# Patient Record
Sex: Female | Born: 2005
Health system: Southern US, Community
[De-identification: ages and names within clinical notes are randomized; demographics above are authoritative.]

## PROBLEM LIST (undated history)

## (undated) DIAGNOSIS — T7840XA Allergy, unspecified, initial encounter: Secondary | ICD-10-CM

## (undated) DIAGNOSIS — R569 Unspecified convulsions: Secondary | ICD-10-CM

## (undated) HISTORY — PX: FEMUR BIOPSY: SHX1592

---
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
## 2024-01-10 ENCOUNTER — Other Ambulatory Visit (HOSPITAL_COMMUNITY): Payer: Self-pay

## 2024-01-11 ENCOUNTER — Other Ambulatory Visit: Payer: Self-pay

## 2024-01-12 ENCOUNTER — Other Ambulatory Visit (HOSPITAL_COMMUNITY): Payer: Self-pay

## 2024-01-23 ENCOUNTER — Other Ambulatory Visit: Payer: Self-pay

## 2024-02-01 ENCOUNTER — Ambulatory Visit: Payer: Commercial Managed Care - PPO | Admitting: Physician Assistant

## 2024-02-01 ENCOUNTER — Encounter: Payer: Self-pay | Admitting: Physician Assistant

## 2024-02-01 VITALS — BP 109/66 | HR 74 | Temp 98.1°F | Ht 62.02 in | Wt 131.6 lb

## 2024-02-01 DIAGNOSIS — Z7189 Other specified counseling: Secondary | ICD-10-CM

## 2024-02-01 DIAGNOSIS — Z7689 Persons encountering health services in other specified circumstances: Secondary | ICD-10-CM

## 2024-02-01 NOTE — Progress Notes (Signed)
 New Patient Office Visit  Subjective    Patient ID: Kristi Henderson, female    DOB: February 24, 2006  Age: 18 y.o. MRN: [id number]  CC: No chief complaint on file.   HPI Kristi Henderson presents to establish care  Patient with no significant medical history presents today to establish care. Daily medications include famotidine , Singulair , and OCPs. She follows with [facility] for management of her birth control. She has no concerns or complaints today. Due for 17 year WCC end of March.   Outpatient Encounter Medications as of 02/01/2024  Medication Sig   albuterol  (VENTOLIN  HFA) 108 (90 Base) MCG/ACT inhaler Inhale 2 puffs into the lungs every 6 (six) hours as needed for wheezing or shortness of breath.   dicyclomine  (BENTYL ) 20 MG tablet Take 1 tablet (20 mg total) by mouth 2 (two) times daily as needed for spasms.   famotidine  (PEPCID ) 20 MG tablet Take 1 tablet by mouth daily   ibuprofen  (ADVIL ) 200 MG tablet Take 800 mg by mouth as needed.   montelukast  (SINGULAIR ) 10 MG tablet Take 1 tablet by mouth every evening   Naproxen Sodium (ALEVE PO) Take by mouth.   Norethindrone-Ethinyl Estradiol -Fe Biphas (LO LOESTRIN FE ) 1 MG-10 MCG / 10 MCG tablet Take 1 tablet by mouth daily.   No facility-administered encounter medications on file as of 02/01/2024.    Past Medical History:  Diagnosis Date   Allergy    Seizures (HCC)    febrile seizure 2008    Past Surgical History:  Procedure Laterality Date   FEMUR BIOPSY     SHOULDER ARTHROSCOPY WITH BANKART REPAIR Left 11/27/2019   Procedure: LEFT SHOULDER ARTHROSCOPY WITH BANKART REPAIR;  Surgeon: Cristy Bonner DASEN, MD;  Location: [facility];  Service: Orthopedics;  Laterality: Left;    Family History  Problem Relation Age of Onset   Diabetes Paternal Grandfather    Heart disease Paternal Grandfather    Dementia Paternal Grandfather    Diabetes Paternal Grandmother    Dementia Paternal Grandmother    Heart disease Paternal  Grandmother    Thyroid disease Paternal Grandmother    Hypertension Father     Social History   Socioeconomic History   Marital status: Single    Spouse name: Not on file   Number of children: Not on file   Years of education: Not on file   Highest education level: 12th grade  Occupational History   Not on file  Tobacco Use   Smoking status: Never   Smokeless tobacco: Never  Vaping Use   Vaping status: Never Used  Substance and Sexual Activity   Alcohol use: Never   Drug use: Never   Sexual activity: Never    Birth control/protection: Abstinence, Pill  Other Topics Concern   Not on file  Social History Narrative   Not on file   Social Drivers of Health   Financial Resource Strain: Low Risk  (01/31/2024)   Overall Financial Resource Strain (CARDIA)    Difficulty of Paying Living Expenses: Not hard at all  Food Insecurity: No Food Insecurity (01/31/2024)   Hunger Vital Sign    Worried About Running Out of Food in the Last Year: Never true    Ran Out of Food in the Last Year: Never true  Transportation Needs: No Transportation Needs (01/31/2024)   PRAPARE - Administrator, Civil Service (Medical): No    Lack of Transportation (Non-Medical): No  Physical Activity: Sufficiently Active (01/31/2024)  Exercise Vital Sign    Days of Exercise per Week: 7 days    Minutes of Exercise per Session: 100 min  Stress: No Stress Concern Present (01/31/2024)   [employer] of Occupational Health - Occupational Stress Questionnaire    Feeling of Stress : Not at all  Social Connections: Moderately Integrated (01/31/2024)   Social Connection and Isolation Panel [NHANES]    Frequency of Communication with Friends and Family: More than three times a week    Frequency of Social Gatherings with Friends and Family: Three times a week    Attends Religious Services: More than 4 times per year    Active Member of Clubs or Organizations: Yes    Attends Banker Meetings:  More than 4 times per year    Marital Status: Never married  Intimate Partner Violence: Not At Risk (03/17/2023)   Humiliation, Afraid, Rape, and Kick questionnaire    Fear of Current or Ex-Partner: No    Emotionally Abused: No    Physically Abused: No    Sexually Abused: No    Review of Systems  Constitutional:  Negative for fever and weight loss.  Eyes:  Negative for blurred vision and double vision.  Respiratory:  Negative for cough and shortness of breath.   Cardiovascular:  Negative for chest pain and palpitations.  Gastrointestinal:  Negative for abdominal pain, heartburn and nausea.  Skin:  Negative for rash.  Neurological:  Negative for headaches.        Objective    BP 109/66   Pulse 74   Temp 98.1 F (36.7 C)   Ht 5' 2.02 (1.575 m)   Wt 131 lb 9.6 oz (59.7 kg)   SpO2 100%   BMI 24.05 kg/m   Physical Exam Constitutional:      Appearance: Normal appearance.  HENT:     Head: Normocephalic.     Mouth/Throat:     Mouth: Mucous membranes are moist.     Pharynx: Oropharynx is clear.  Eyes:     Extraocular Movements: Extraocular movements intact.     Conjunctiva/sclera: Conjunctivae normal.  Cardiovascular:     Rate and Rhythm: Normal rate and regular rhythm.     Heart sounds: No murmur heard.    No gallop.  Pulmonary:     Effort: Pulmonary effort is normal.     Breath sounds: No wheezing, rhonchi or rales.  Skin:    General: Skin is warm and dry.  Neurological:     General: No focal deficit present.     Mental Status: She is alert and oriented to person, place, and time.  Psychiatric:        Mood and Affect: Mood normal.        Behavior: Behavior normal.       Assessment & Plan:  Encounter to establish care  Patient appears well today, physical exam without any abnormal findings. She does not need medication refills today. Obtaining vaccination records to update Tdap and HPV in the system as patient reports receiving these vaccines. Patient  following up in April for annual physical.   Return in about 2 months (around 03/31/2024) for [facility] 17y.   Kristi Laylaa Guevarra, PA-C

## 2024-02-12 DIAGNOSIS — S46211A Strain of muscle, fascia and tendon of other parts of biceps, right arm, initial encounter: Secondary | ICD-10-CM | POA: Diagnosis not present

## 2024-02-19 DIAGNOSIS — S46211D Strain of muscle, fascia and tendon of other parts of biceps, right arm, subsequent encounter: Secondary | ICD-10-CM | POA: Diagnosis not present

## 2024-03-14 ENCOUNTER — Other Ambulatory Visit (HOSPITAL_COMMUNITY): Payer: Self-pay

## 2024-03-18 DIAGNOSIS — S46211D Strain of muscle, fascia and tendon of other parts of biceps, right arm, subsequent encounter: Secondary | ICD-10-CM | POA: Diagnosis not present

## 2024-03-31 ENCOUNTER — Encounter: Payer: Self-pay | Admitting: Physician Assistant

## 2024-03-31 ENCOUNTER — Ambulatory Visit: Payer: Self-pay | Admitting: Physician Assistant

## 2024-03-31 VITALS — BP 118/64 | HR 66 | Temp 98.1°F | Ht 63.78 in | Wt 134.4 lb

## 2024-03-31 DIAGNOSIS — Z0000 Encounter for general adult medical examination without abnormal findings: Secondary | ICD-10-CM

## 2024-03-31 DIAGNOSIS — Z68.41 Body mass index (BMI) pediatric, 5th percentile to less than 85th percentile for age: Secondary | ICD-10-CM | POA: Diagnosis not present

## 2024-03-31 NOTE — Progress Notes (Signed)
 Complete physical exam  Patient: Kristi Henderson   DOB: 2006/12/02   17 y.o. Female  MRN: [id number]  Subjective:    No chief complaint on file.   Kristi Henderson is a 18 y.o. female who presents today for a wellness visit.   A review of their health history was completed. A review of medications was also completed.  Any needed refills; No  Eating habits: Balanced diet, good fruits and vegetables  Falls/  MVA accidents in past few months: No  Regular exercise: Yes, daily plays softball   Specialist pt sees on regular basis: No  Preventative health issues were discussed.   Additional concerns:  Had high blood pressure at last sports physical. No headaches or visual changes. Normal readings at home.    Most recent fall risk assessment:    03/31/2024   10:57 AM  Fall Risk   Falls in the past year? 0     Most recent depression screenings:    03/31/2024   10:57 AM 02/01/2024    1:32 PM  PHQ 2/9 Scores  PHQ - 2 Score 0 0  PHQ- 9 Score 0 0    Vision:Within last year and Dental: No current dental problems and Receives regular dental care  Patient Care Team: Naji Mehringer, Toni Amend, [place] as PCP - General (Physician Assistant) Lurena Nida, PTA as Physical Therapy Assistant (Physical Therapy)   Outpatient Medications Prior to Visit  Medication Sig   albuterol (VENTOLIN HFA) 108 (90 Base) MCG/ACT inhaler Inhale 2 puffs into the lungs every 6 (six) hours as needed for wheezing or shortness of breath.   dicyclomine (BENTYL) 20 MG tablet Take 1 tablet (20 mg total) by mouth 2 (two) times daily as needed for spasms.   famotidine (PEPCID) 20 MG tablet Take 1 tablet by mouth daily   ibuprofen (ADVIL) 200 MG tablet Take 800 mg by mouth as needed.   montelukast (SINGULAIR) 10 MG tablet Take 1 tablet by mouth every evening   Naproxen Sodium (ALEVE PO) Take by mouth.   Norethindrone-Ethinyl Estradiol-Fe Biphas (LO LOESTRIN FE) 1 MG-10 MCG / 10 MCG tablet Take 1 tablet by mouth daily.    No facility-administered medications prior to visit.    Review of Systems  Constitutional:  Negative for chills, fever, malaise/fatigue and weight loss.  HENT:  Negative for congestion and sore throat.   Eyes:  Negative for blurred vision and double vision.  Respiratory:  Negative for cough, shortness of breath and wheezing.   Cardiovascular:  Negative for chest pain and palpitations.  Gastrointestinal:  Negative for constipation, diarrhea, nausea and vomiting.  Genitourinary:  Negative for dysuria.  Musculoskeletal:  Negative for joint pain and myalgias.  Neurological:  Negative for dizziness and headaches.  Psychiatric/Behavioral:  Negative for depression. The patient is not nervous/anxious.        Objective:     BP 118/64   Pulse 66   Temp 98.1 F (36.7 C)   Ht 5' 3.78" (1.62 m)   Wt 134 lb 6.4 oz (61 kg)   SpO2 98%   BMI 23.23 kg/m    Physical Exam Constitutional:      Appearance: Normal appearance. She is normal weight.  HENT:     Head: Normocephalic.     Right Ear: Tympanic membrane normal.     Left Ear: Tympanic membrane normal.     Nose: Nose normal.     Mouth/Throat:     Mouth: Mucous membranes are moist.  Pharynx: Oropharynx is clear. No posterior oropharyngeal erythema.  Eyes:     Extraocular Movements: Extraocular movements intact.     Conjunctiva/sclera: Conjunctivae normal.  Neck:     Thyroid: No thyroid mass, thyromegaly or thyroid tenderness.  Cardiovascular:     Rate and Rhythm: Normal rate and regular rhythm.     Heart sounds: Normal heart sounds.  Pulmonary:     Effort: Pulmonary effort is normal.     Breath sounds: Normal breath sounds. No wheezing.  Abdominal:     General: Abdomen is flat. Bowel sounds are normal.     Palpations: Abdomen is soft.     Tenderness: There is no abdominal tenderness.  Musculoskeletal:     Cervical back: Normal range of motion and neck supple.  Lymphadenopathy:     Cervical: No cervical adenopathy.   Skin:    General: Skin is warm and dry.  Neurological:     General: No focal deficit present.     Mental Status: She is alert and oriented to person, place, and time.  Psychiatric:        Mood and Affect: Mood normal.        Behavior: Behavior normal.      No results found for any visits on 03/31/24. Last CBC Lab Results  Component Value Date   WBC 6.6 01/23/2022   HGB 13.1 06/05/2023   HCT 39.2 01/23/2022   MCV 93.1 01/23/2022   MCH 30.9 01/23/2022   RDW 12.8 01/23/2022   PLT 276 01/23/2022   Last metabolic panel Lab Results  Component Value Date   GLUCOSE 90 01/23/2022   NA 135 01/23/2022   K 3.6 01/23/2022   CL 105 01/23/2022   CO2 24 01/23/2022   BUN 11 01/23/2022   CREATININE 0.65 01/23/2022   GFRNONAA NOT CALCULATED 01/23/2022   CALCIUM 9.1 01/23/2022   PROT 7.0 01/23/2022   ALBUMIN 4.1 01/23/2022   BILITOT 0.3 01/23/2022   ALKPHOS 87 01/23/2022   AST 18 01/23/2022   ALT 13 01/23/2022   ANIONGAP 6 01/23/2022      Assessment & Plan:    Routine Health Maintenance and Physical Exam  Health Maintenance  Topic Date Due   HIV Screening  Never done   COVID-19 Vaccine (1 - 2024-25 season) Never done   Hepatitis C Screening  Never done   Flu Shot  07/25/2024   DTaP/Tdap/Td vaccine (8 - Td or Tdap) 10/09/2026   HPV Vaccine  Completed    Discussed health benefits of physical activity, and encouraged her to engage in regular exercise appropriate for her age and condition.  Problem List Items Addressed This Visit   None Visit Diagnoses       Encounter for general adult medical examination without abnormal findings    -  Primary     Body mass index, pediatric, 5th percentile to less than 85th percentile for age          Adult wellness-complete.wellness physical was conducted today. Importance of diet and exercise were discussed in detail.  Importance of stress reduction and healthy living were discussed.  In addition to this a discussion regarding  safety was also covered.  We also reviewed over immunizations and gave recommendations regarding current immunization needed for age.   In addition to this additional areas were also touched on including: blood pressure warning signs and discussed when follow up would be warranted. I feel as if blood pressure reading was an isolated incident with normal blood pressure readings  in office and at home   Preventative health exams needed: none   Patient was advised yearly wellness exam   Return in 1 year (on 03/31/2025).     Toni Amend Tima Curet, PA-C

## 2024-03-31 NOTE — Progress Notes (Deleted)
   Subjective:    Patient ID: Kristi Henderson, female    DOB: 2006/09/06, 18 y.o.   MRN: [id number]  HPI   Review of Systems     Objective:   Physical Exam       Assessment & Plan:  Encounter for general adult medical examination without abnormal findings  Body mass index, pediatric, 5th percentile to less than 85th percentile for age

## 2024-06-02 ENCOUNTER — Other Ambulatory Visit (HOSPITAL_COMMUNITY): Payer: Self-pay

## 2024-06-12 ENCOUNTER — Encounter: Payer: Self-pay | Admitting: Physician Assistant

## 2024-06-13 ENCOUNTER — Other Ambulatory Visit: Payer: Self-pay | Admitting: Physician Assistant

## 2024-06-13 DIAGNOSIS — Z130 Encounter for screening for diseases of the blood and blood-forming organs and certain disorders involving the immune mechanism: Secondary | ICD-10-CM

## 2024-06-13 DIAGNOSIS — Z025 Encounter for examination for participation in sport: Secondary | ICD-10-CM

## 2024-06-17 ENCOUNTER — Ambulatory Visit

## 2024-06-17 DIAGNOSIS — Z111 Encounter for screening for respiratory tuberculosis: Secondary | ICD-10-CM

## 2024-06-19 LAB — TB SKIN TEST
Induration: 0 mm
TB Skin Test: NEGATIVE

## 2024-06-21 LAB — SICKLE CELL SCREEN: Sickle Cell Screen: NEGATIVE

## 2024-06-23 ENCOUNTER — Ambulatory Visit: Payer: Self-pay | Admitting: Physician Assistant

## 2024-08-04 ENCOUNTER — Other Ambulatory Visit: Payer: Self-pay | Admitting: Adult Health

## 2024-08-05 ENCOUNTER — Other Ambulatory Visit (HOSPITAL_COMMUNITY): Payer: Self-pay

## 2024-08-05 MED ORDER — LO LOESTRIN FE 1 MG-10 MCG / 10 MCG PO TABS
1.0000 | ORAL_TABLET | Freq: Every day | ORAL | 3 refills | Status: DC
  Filled 2024-08-05 – 2024-08-09 (×3): qty 84, 84d supply, fill #0
  Filled 2024-10-08 – 2024-10-24 (×3): qty 84, 84d supply, fill #1

## 2024-08-08 ENCOUNTER — Other Ambulatory Visit (HOSPITAL_COMMUNITY): Payer: Self-pay

## 2024-08-09 ENCOUNTER — Other Ambulatory Visit (HOSPITAL_COMMUNITY): Payer: Self-pay

## 2024-09-15 ENCOUNTER — Ambulatory Visit: Payer: Self-pay | Admitting: *Deleted

## 2024-09-15 NOTE — Telephone Encounter (Signed)
 FYI Only or Action Required?: FYI only for provider.  Patient was last seen in primary care on 03/31/2024 by [place], [place], [place].  Called Nurse Triage reporting Bleeding/Bruising.  Symptoms began today.  Interventions attempted: Other: pinced nose .  Symptoms are: stable.  Triage Disposition: See Physician Within 24 Hours  Patient/caregiver understands and will follow disposition?: Yes   Patient to do VV since she is at college. Recommended if nose bleed occurs again and takes greater than 30 minutes go to ED.           Copied from CRM #[id number]. Topic: Clinical - Red Word Triage >> Sep 15, 2024  3:33 PM Ivette P wrote: Red Word that prompted transfer to Nurse Triage: nose bleeds, 30-45 min per session. Reason for Disposition  [1] Large amount of blood has been lost (e.g., 1 cup or 240 ml) AND [2] bleeding now controlled (stopped)  Answer Assessment - Initial Assessment Questions VV appt scheduled for tomorrow with PCP. Patient at college. Requesting referral ENT. Episode of nose bleeds 1-2 per month and this am bleeding took 30-45 minutes to stop. Mother denies bleeding now and is not with patient but texting on phone. Recommended if nose bleeds     1. AMOUNT OF BLEEDING: How bad is the bleeding? How much blood was lost? Has the bleeding stopped?     Taking 30- 45 minutes to stop this am and this am woke up on blood clot  per patient mother on DPR . Patient at college now  2. ONSET: When did the nosebleed start?      This am but has had issues having 1-2 per month 3. FREQUENCY: How many nosebleeds have you had in the last 24 hours?      1 nose bleed lasting 30-45 minutes to stop this am and large blood clots noted 4. RECURRENT SYMPTOMS: Have there been other recent nosebleeds? If Yes, ask: How long did it take you to stop the bleeding? What worked best?      Yes  5. CAUSE: What do you think caused this nosebleed?     Not sure  6. LOCAL FACTORS: Do  you have any cold symptoms?, Have you been rubbing or picking at your nose?     No  7. SYSTEMIC FACTORS: Do you have high blood pressure or any bleeding problems?    No  8. BLOOD THINNERS: Do you take any blood thinners? (e.g., aspirin, clopidogrel / Plavix, coumadin, heparin). Notes: Other strong blood thinners include: Arixtra (fondaparinux), Eliquis (apixaban), Pradaxa (dabigatran), and Xarelto (rivaroxaban).     na 9. OTHER SYMPTOMS: Do you have any other symptoms? (e.g., lightheadedness)     No bleeding now . Denies lightheadedness, no weakness.  10. PREGNANCY: Is there any chance you are pregnant? When was your last menstrual period?       On birth control.  Protocols used: Nosebleed-A-AH

## 2024-09-16 ENCOUNTER — Encounter: Payer: Self-pay | Admitting: Physician Assistant

## 2024-09-16 ENCOUNTER — Telehealth: Admitting: Physician Assistant

## 2024-09-16 DIAGNOSIS — R040 Epistaxis: Secondary | ICD-10-CM

## 2024-09-16 NOTE — Assessment & Plan Note (Addendum)
 Patient presents today with concerns for recurrent nosebleeds. Referral placed to ENT. Discussed home management of nosebleeds including, pressure, cold compress, Afrin nasal spray, and head leaning forward. Discussed when to seek care in UC or the ER. Patient is in no acute distress at the time of our visit. Patient to contact via MyChart with additional questions or concerns as she is [place] for college.

## 2024-09-16 NOTE — Progress Notes (Signed)
 Virtual Visit via Video Note  I connected with Kristi Henderson on 09/16/24 at 11:20 AM EDT by a video enabled telemedicine application and verified that I am speaking with the correct person using two identifiers.  Patient Location: Home Provider Location: Office/Clinic  I discussed the limitations, risks, security, and privacy concerns of performing an evaluation and management service by video and the availability of in person appointments. I also discussed with the patient that there may be a patient responsible charge related to this service. The patient expressed understanding and agreed to proceed.  Subjective: PCP: Dorma Altman, PA-C  Chief Complaint  Patient presents with   Epistaxis    Requesting referral to ENT   Patient presents today via video visit for concerns of nosebleeds. She reports 4 nosebleeds over the course of 1 month. She relates she is not a stranger to nosebleeds, but reports these nosebleeds are different than her typical nosebleeds. She states spontaneous onset of nosebleeds and flow is heavy, and typically symptoms last approximately 10 minutes. She is able to stop bleeding on her own at home. She is interested in referral to ENT due to change in nosebleeds compared to her baseline. She denies difficulties breathing. No history of bleeding disorders.      ROS: Per HPI  Current Outpatient Medications:    albuterol  (VENTOLIN  HFA) 108 (90 Base) MCG/ACT inhaler, Inhale 2 puffs into the lungs every 6 (six) hours as needed for wheezing or shortness of breath., Disp: 8 g, Rfl: 0   dicyclomine  (BENTYL ) 20 MG tablet, Take 1 tablet (20 mg total) by mouth 2 (two) times daily as needed for spasms., Disp: 20 tablet, Rfl: 0   famotidine  (PEPCID ) 20 MG tablet, Take 1 tablet by mouth daily, Disp: 30 tablet, Rfl: 3   ibuprofen  (ADVIL ) 200 MG tablet, Take 800 mg by mouth as needed., Disp: , Rfl:    montelukast  (SINGULAIR ) 10 MG tablet, Take 1 tablet by mouth every evening,  Disp: 30 tablet, Rfl: 3   Naproxen Sodium (ALEVE PO), Take by mouth., Disp: , Rfl:    Norethindrone-Ethinyl Estradiol -Fe Biphas (LO LOESTRIN FE ) 1 MG-10 MCG / 10 MCG tablet, Take 1 tablet by mouth daily., Disp: 84 tablet, Rfl: 3  Observations/Objective: There were no vitals filed for this visit. Physical Exam Constitutional:      General: She is not in acute distress.    Appearance: Normal appearance. She is not ill-appearing.  Pulmonary:     Effort: Pulmonary effort is normal.  Musculoskeletal:     Cervical back: Normal range of motion.  Neurological:     General: No focal deficit present.     Mental Status: She is alert.     Assessment and Plan: Epistaxis, recurrent Assessment & Plan: Patient presents today with concerns for recurrent nosebleeds. Referral placed to ENT. Discussed home management of nosebleeds including, pressure, cold compress, Afrin nasal spray, and head leaning forward. Discussed when to seek care in UC or the ER. Patient is in no acute distress at the time of our visit. Patient to contact via MyChart with additional questions or concerns as she is out of [place] for college.   Orders: -     Ambulatory referral to ENT    Follow Up Instructions: Return if symptoms worsen or fail to improve.   I discussed the assessment and treatment plan with the patient. The patient was provided an opportunity to ask questions, and all were answered. The patient agreed with the plan and demonstrated  an understanding of the instructions.   The patient was advised to call back or seek an in-person evaluation if the symptoms worsen or if the condition fails to improve as anticipated.  The above assessment and management plan was discussed with the patient. The patient verbalized understanding of and has agreed to the management plan.   Charmaine Garnie Borchardt, PA-C

## 2024-10-08 ENCOUNTER — Other Ambulatory Visit (HOSPITAL_COMMUNITY): Payer: Self-pay

## 2024-10-08 DIAGNOSIS — R051 Acute cough: Secondary | ICD-10-CM | POA: Diagnosis not present

## 2024-10-08 DIAGNOSIS — J029 Acute pharyngitis, unspecified: Secondary | ICD-10-CM | POA: Diagnosis not present

## 2024-10-24 ENCOUNTER — Other Ambulatory Visit (HOSPITAL_COMMUNITY): Payer: Self-pay

## 2024-10-24 ENCOUNTER — Other Ambulatory Visit: Payer: Self-pay

## 2024-10-27 ENCOUNTER — Other Ambulatory Visit: Payer: Self-pay | Admitting: Adult Health

## 2024-10-29 ENCOUNTER — Encounter: Payer: Self-pay | Admitting: *Deleted

## 2024-11-12 ENCOUNTER — Telehealth: Payer: Self-pay | Admitting: Physician Assistant

## 2024-11-12 DIAGNOSIS — J019 Acute sinusitis, unspecified: Secondary | ICD-10-CM | POA: Diagnosis not present

## 2024-11-12 DIAGNOSIS — B9689 Other specified bacterial agents as the cause of diseases classified elsewhere: Secondary | ICD-10-CM | POA: Diagnosis not present

## 2024-11-12 MED ORDER — AMOXICILLIN-POT CLAVULANATE 875-125 MG PO TABS: 1.0000 | ORAL_TABLET | Freq: Two times a day (BID) | ORAL | 0 refills | Status: AC

## 2024-11-12 NOTE — Progress Notes (Signed)

## 2024-12-11 ENCOUNTER — Other Ambulatory Visit (HOSPITAL_COMMUNITY): Payer: Self-pay

## 2024-12-11 ENCOUNTER — Encounter (INDEPENDENT_AMBULATORY_CARE_PROVIDER_SITE_OTHER): Payer: Self-pay

## 2024-12-11 ENCOUNTER — Ambulatory Visit (INDEPENDENT_AMBULATORY_CARE_PROVIDER_SITE_OTHER)

## 2024-12-11 VITALS — Ht 62.5 in | Wt 130.0 lb

## 2024-12-11 DIAGNOSIS — J342 Deviated nasal septum: Secondary | ICD-10-CM

## 2024-12-11 DIAGNOSIS — R040 Epistaxis: Secondary | ICD-10-CM | POA: Diagnosis not present

## 2024-12-11 MED ORDER — AYR SALINE NASAL NA GEL
1.0000 | Freq: Every evening | NASAL | 0 refills | Status: AC
  Filled 2024-12-11: qty 14.1, 14d supply, fill #0

## 2024-12-11 NOTE — Progress Notes (Unsigned)
 Dear Dr. Mancil, Here is my assessment for our mutual patient, Kristi Henderson. Thank you for allowing me the opportunity to care for your patient. Please do not hesitate to contact me should you have any other questions. Sincerely, Dr. Hadassah Parody  Otolaryngology Clinic Note Referring provider: Dr. Mancil HPI:   Initial HPI (12/11/2024) Kristi Henderson is a 18 y.o. female kindly referred by Dr. Mancil for evaluation of ***.   H&N Surgery: *** Personal or FHx of bleeding dz or anesthesia difficulty: no ***  GLP-1: *** AP/AC: ***  Tobacco: *** Alcohol: ***.  Independent Review of Additional Tests or Records:  ***   PMH/Meds/All/SocHx/FamHx/ROS:   Past Medical History:  Diagnosis Date   Allergy    Seizures (HCC)    febrile seizure 2008     Past Surgical History:  Procedure Laterality Date   FEMUR BIOPSY     SHOULDER ARTHROSCOPY WITH BANKART REPAIR Left 11/27/2019   Procedure: LEFT SHOULDER ARTHROSCOPY WITH BANKART REPAIR;  Surgeon: Cristy Bonner DASEN, MD;  Location: [facility];  Service: Orthopedics;  Laterality: Left;    Family History  Problem Relation Age of Onset   Diabetes Paternal Grandfather    Heart disease Paternal Grandfather    Dementia Paternal Grandfather    Diabetes Paternal Grandmother    Dementia Paternal Grandmother    Heart disease Paternal Grandmother    Thyroid disease Paternal Grandmother    Hypertension Father      Social Connections: Moderately Integrated (01/31/2024)   Social Connection and Isolation Panel    Frequency of Communication with Friends and Family: More than three times a week    Frequency of Social Gatherings with Friends and Family: Three times a week    Attends Religious Services: More than 4 times per year    Active Member of Clubs or Organizations: Yes    Attends Engineer, Structural: More than 4 times per year    Marital Status: Never married     Current Outpatient Medications  Medication Instructions    albuterol  (VENTOLIN  HFA) 108 (90 Base) MCG/ACT inhaler 2 puffs, Inhalation, Every 6 hours PRN   amoxicillin -clavulanate (AUGMENTIN ) 875-125 MG tablet 1 tablet, Oral, 2 times daily   dicyclomine  (BENTYL ) 20 mg, Oral, 2 times daily PRN   famotidine  (PEPCID ) 20 MG tablet Take 1 tablet by mouth daily   ibuprofen  (ADVIL ) 800 mg, As needed   montelukast  (SINGULAIR ) 10 MG tablet Take 1 tablet by mouth every evening   Naproxen Sodium (ALEVE PO) Take by mouth.   Norethindrone-Ethinyl Estradiol -Fe Biphas (LO LOESTRIN FE ) 1 MG-10 MCG / 10 MCG tablet 1 tablet, Oral, Daily     Physical Exam:   Ht 5' 2.5 (1.588 m)   Wt 130 lb (59 kg)   BMI 23.40 kg/m   Salient findings:  CN II-XII intact *** Bilateral EAC clear and TM intact with well pneumatized middle ear spaces Weber 512: *** Rinne 512: AC > BC b/l *** Rine 1024: AC > BC b/l *** Anterior rhinoscopy: Septum ***; bilateral inferior turbinates with *** No lesions of oral cavity/oropharynx; dentition *** No obviously palpable neck masses/lymphadenopathy/thyromegaly No respiratory distress or stridor***  Seprately Identifiable Procedures:  Prior to initiating any procedures, risks/benefits/alternatives were explained to the patient and verbal consent obtained. None***  Impression & Plans:  Kristi Henderson is a 18 y.o. female with ***  No diagnosis found.   See below regarding exact medications prescribed this encounter including dosages and route: No orders of the defined types  were placed in this encounter.     Thank you for allowing me the opportunity to care for your patient. Please do not hesitate to contact me should you have any other questions.  Sincerely, Hadassah Parody, MD Otolaryngologist (ENT), [facility] Phone: [phone] Fax: [phone]  MDM:  Level *** Complexity/Problems addressed: *** Data complexity: *** independent review of *** - Morbidity: ***  - Prescription Drug prescribed or managed:  ***

## 2024-12-11 NOTE — Patient Instructions (Signed)
 Instructions to help prevent future episodes of nose bleeds and management: - Reduce nasal manipulation - no nose picking, no tissues in the nose other than for dabbing.  - Use Afrin nasal spray for ACTIVE bleeding on the bleeding side and hold firm, continuous nasal pressure (on nostril - soft part of the nose, not the bone)  for 15 mins continuously for ACTIVE bleeding.  - Frequent nasal saline spray (every 4-6 hours as needed) to keep nose moist. - Apply over-the-counter nasal saline GEL (such as Ayr GEL) to the nostrils (use pea sized amount on pinkie finger and put on the nostril - not the middle part of the septum) and then gently pinch the nostril. Do at least 3 times per day but can do it as many times as you want - May use humidifer in bedroom at night. - Maintain tight BP control, as high blood pressure will certainly increase epistaxis occurrences.

## 2024-12-12 ENCOUNTER — Other Ambulatory Visit (HOSPITAL_COMMUNITY): Payer: Self-pay

## 2024-12-12 ENCOUNTER — Other Ambulatory Visit: Payer: Self-pay

## 2024-12-16 ENCOUNTER — Other Ambulatory Visit: Payer: Self-pay

## 2024-12-20 ENCOUNTER — Other Ambulatory Visit (HOSPITAL_COMMUNITY): Payer: Self-pay

## 2024-12-20 ENCOUNTER — Other Ambulatory Visit: Payer: Self-pay | Admitting: Adult Health

## 2024-12-22 ENCOUNTER — Other Ambulatory Visit (HOSPITAL_COMMUNITY): Payer: Self-pay

## 2024-12-22 ENCOUNTER — Other Ambulatory Visit: Payer: Self-pay

## 2024-12-22 MED ORDER — LO LOESTRIN FE 1 MG-10 MCG / 10 MCG PO TABS
1.0000 | ORAL_TABLET | Freq: Every day | ORAL | 3 refills | Status: AC
  Filled 2024-12-22 – 2024-12-24 (×2): qty 84, 84d supply, fill #0

## 2024-12-24 ENCOUNTER — Other Ambulatory Visit (HOSPITAL_COMMUNITY): Payer: Self-pay

## 2024-12-24 ENCOUNTER — Other Ambulatory Visit: Payer: Self-pay
